# Patient Record
Sex: Male | Born: 1990 | Race: Black or African American | Hispanic: No | Marital: Single | Smoking: Never smoker
Health system: Southern US, Community
[De-identification: ages and names within clinical notes are randomized; demographics above are authoritative.]

---
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
## 2016-12-24 ENCOUNTER — Encounter (HOSPITAL_COMMUNITY): Payer: Self-pay | Admitting: Emergency Medicine

## 2016-12-24 ENCOUNTER — Emergency Department (HOSPITAL_COMMUNITY)

## 2016-12-24 ENCOUNTER — Other Ambulatory Visit: Payer: Self-pay

## 2016-12-24 ENCOUNTER — Emergency Department (HOSPITAL_COMMUNITY)
Admission: EM | Admit: 2016-12-24 | Discharge: 2016-12-24 | Disposition: A | Discharge status: Home / Self Care | Attending: Emergency Medicine | Admitting: Emergency Medicine

## 2016-12-24 DIAGNOSIS — Z79899 Other long term (current) drug therapy: Secondary | ICD-10-CM | POA: Diagnosis not present

## 2016-12-24 DIAGNOSIS — Y929 Unspecified place or not applicable: Secondary | ICD-10-CM | POA: Insufficient documentation

## 2016-12-24 DIAGNOSIS — X501XXA Overexertion from prolonged static or awkward postures, initial encounter: Secondary | ICD-10-CM | POA: Insufficient documentation

## 2016-12-24 DIAGNOSIS — S93402A Sprain of unspecified ligament of left ankle, initial encounter: Secondary | ICD-10-CM

## 2016-12-24 DIAGNOSIS — Y999 Unspecified external cause status: Secondary | ICD-10-CM | POA: Insufficient documentation

## 2016-12-24 DIAGNOSIS — Y9302 Activity, running: Secondary | ICD-10-CM | POA: Insufficient documentation

## 2016-12-24 MED ORDER — IBUPROFEN 600 MG PO TABS: 600.0000 mg | ORAL_TABLET | Freq: Four times a day (QID) | ORAL | 0 refills | Status: AC | PRN

## 2016-12-24 NOTE — ED Provider Notes (Signed)
[facility] EMERGENCY DEPARTMENT Provider Note   CSN: [id number] Arrival date & time: 12/24/16  1704     History   Chief Complaint Chief Complaint  Patient presents with  . Ankle Pain    HPI Ricky Allen is a 26 y.o. male presenting with left ankle pain which occurred suddenly when the patient inverted the ankle when running about 4 hours ago.  Pain is aching, constant and worse with palpation, movement and weight bearing.  The patient was able to weight bear immediately after the event.  There is no radiation of pain and the patient denies numbness distal to the injury site.  The patients treatment prior to arrival included ibuprofen, rest and ice. Marland Kitchen.  HPI  History reviewed. No pertinent past medical history.  There are no active problems to display for this patient.   History reviewed. No pertinent surgical history.     Home Medications    Prior to Admission medications   Medication Sig Start Date End Date Taking? Authorizing Provider  ibuprofen (ADVIL,MOTRIN) 600 MG tablet Take 1 tablet (600 mg total) by mouth every 6 (six) hours as needed. 12/24/16   Burgess AmorIdol, Clariza Sickman, PA-C  ondansetron (ZOFRAN ODT) 4 MG disintegrating tablet Take 1 tablet (4 mg total) by mouth every 8 (eight) hours as needed for nausea. 12/04/11   Eber HongMiller, Brian, MD  potassium chloride (K-DUR) 10 MEQ tablet Take 1 tablet (10 mEq total) by mouth 2 (two) times daily. 12/04/11   Eber HongMiller, Brian, MD  promethazine (PHENERGAN) 25 MG tablet Take 1 tablet (25 mg total) by mouth every 6 (six) hours as needed for nausea. 12/04/11   Eber HongMiller, Brian, MD    Family History History reviewed. No pertinent family history.  Social History Social History   Tobacco Use  . Smoking status: Never Smoker  . Smokeless tobacco: Never Used  Substance Use Topics  . Alcohol use: No  . Drug use: No     Allergies   Patient has no known allergies.   Review of Systems Review of Systems  Musculoskeletal: Positive for  arthralgias and joint swelling.  Skin: Negative for wound.  Neurological: Negative for weakness and numbness.     Physical Exam Updated Vital Signs BP (!) 147/78   Pulse (!) 50   Temp 98.4 F (36.9 C) (Oral)   Resp 18   Ht 6\' 3"  (1.905 m)   Wt 102.1 kg (225 lb)   SpO2 100%   BMI 28.12 kg/m   Physical Exam  Constitutional: He appears well-developed and well-nourished.  HENT:  Head: Normocephalic.  Cardiovascular: Normal rate and intact distal pulses. Exam reveals no decreased pulses.  Pulses:      Dorsalis pedis pulses are 2+ on the right side, and 2+ on the left side.       Posterior tibial pulses are 2+ on the right side, and 2+ on the left side.  Musculoskeletal: He exhibits edema and tenderness.       Left ankle: He exhibits swelling. He exhibits no ecchymosis, no deformity and normal pulse. Tenderness. Lateral malleolus tenderness found. No head of 5th metatarsal and no proximal fibula tenderness found. Achilles tendon normal.  Neurological: He is alert. No sensory deficit.  Skin: Skin is warm, dry and intact.  Nursing note and vitals reviewed.    ED Treatments / Results  Labs (all labs ordered are listed, but only abnormal results are displayed) Labs Reviewed - No data to display  EKG  EKG Interpretation None  Radiology Dg Ankle Complete Left  Result Date: 12/24/2016 CLINICAL DATA:  Inversion injury today left ankle while playing basketball with lateral pain and swelling. EXAM: LEFT ANKLE COMPLETE - 3+ VIEW COMPARISON:  None. FINDINGS: Soft tissue swelling laterally. Ankle mortise is normal. There is no acute fracture or dislocation. Chronic well corticated oval 2.5 mm fragment distal to the fibula. IMPRESSION: No acute fracture. Electronically Signed   By: Elberta Fortisaniel  Boyle M.D.   On: 12/24/2016 17:45    Procedures Procedures (including critical care time)  Medications Ordered in ED Medications - No data to display   Initial Impression / Assessment  and Plan / ED Course  I have reviewed the triage vital signs and the nursing notes.  Pertinent labs & imaging results that were available during my care of the patient were reviewed by me and considered in my medical decision making (see chart for details).     Pt with ankle sprain per exam and imaging, RICE, crutches, aso provided. Plan f/u with primary provider in 7-10 days for recheck if not improved ([place] [employer] [place]  Final Clinical Impressions(s) / ED Diagnoses   Final diagnoses:  Sprain of left ankle, unspecified ligament, initial encounter    ED Discharge Orders        Ordered    ibuprofen (ADVIL,MOTRIN) 600 MG tablet  Every 6 hours PRN     12/24/16 1726       Burgess Amordol, Shree Espey, PA-C 12/24/16 1746    Burgess AmorIdol, Maansi Wike, PA-C 12/24/16 1751    Mancel BaleWentz, Elliott, MD 12/24/16 [phone]

## 2016-12-24 NOTE — Discharge Instructions (Addendum)
Wear the ASO and use crutches to avoid weight bearing.  Use ice and elevation as much as possible for the next several days to help reduce the swelling.  Your xrays show no evidence of a break.   You may take the ibuprofen prescribed for swelling and pain relief.  This will make you drowsy - do not drive within 4 hours of taking this medication.   You may benefit from physical therapy of your ankle if it is not getting better over the next week.

## 2016-12-24 NOTE — ED Triage Notes (Signed)
Pt was playing basketball and rolled left ankle. Swelling noted. VSS.

## 2018-05-05 IMAGING — DX DG ANKLE COMPLETE 3+V*L*
3 series · 3 of 3 positions shown · non-contrast
Comparison: None.

CLINICAL DATA: Inversion injury today left ankle while playing
basketball with lateral pain and swelling.

EXAM:
LEFT ANKLE COMPLETE - 3+ VIEW

[ankle obl]
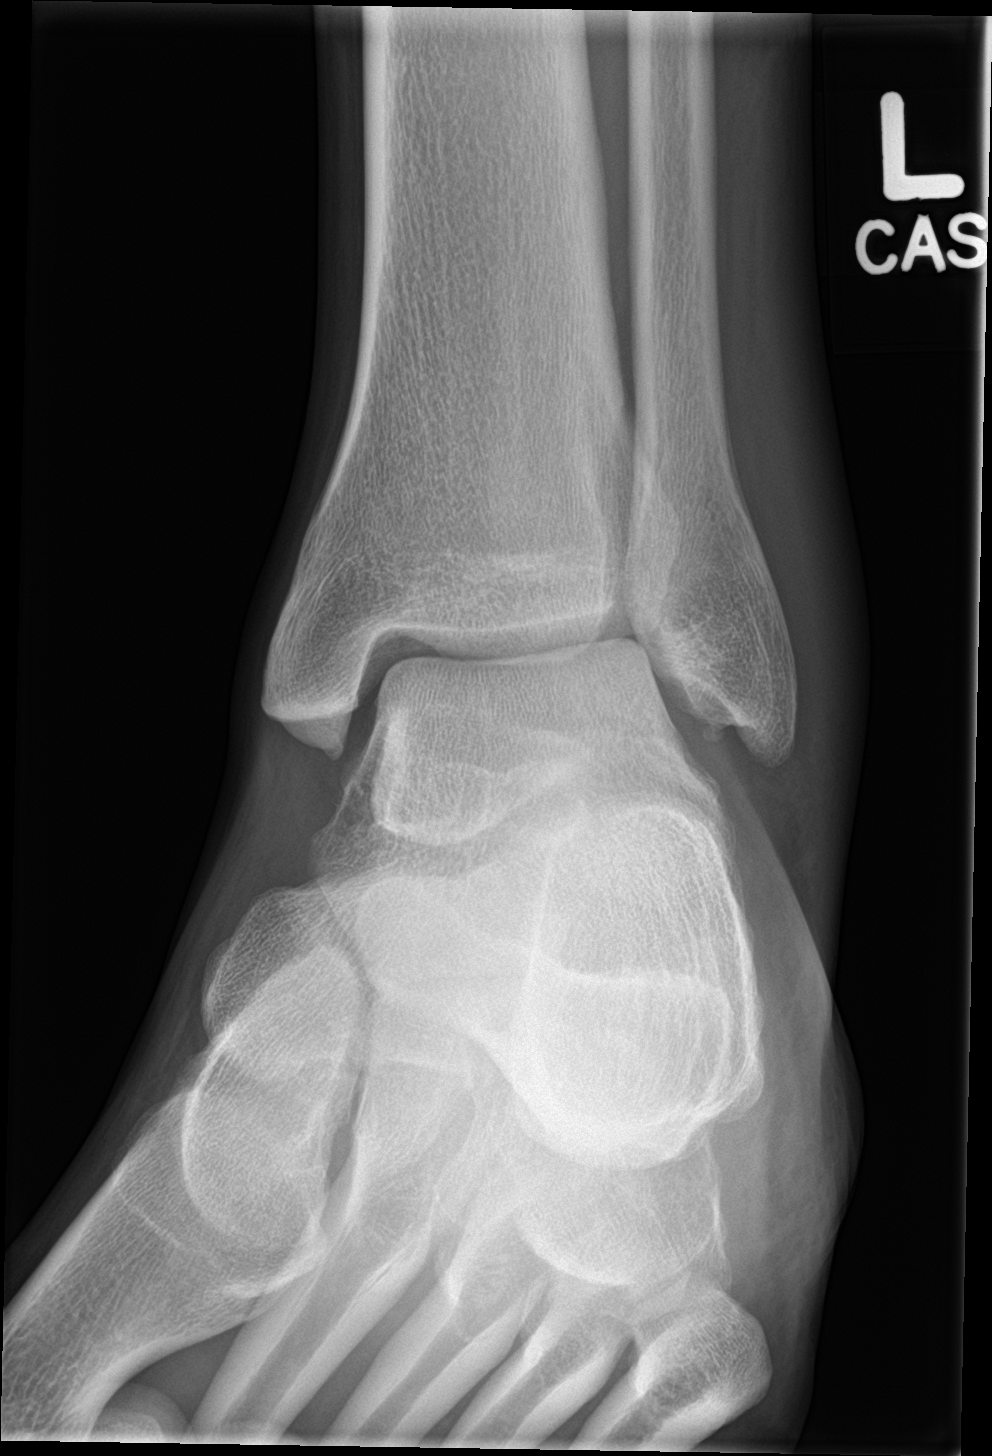

[ankle lat]
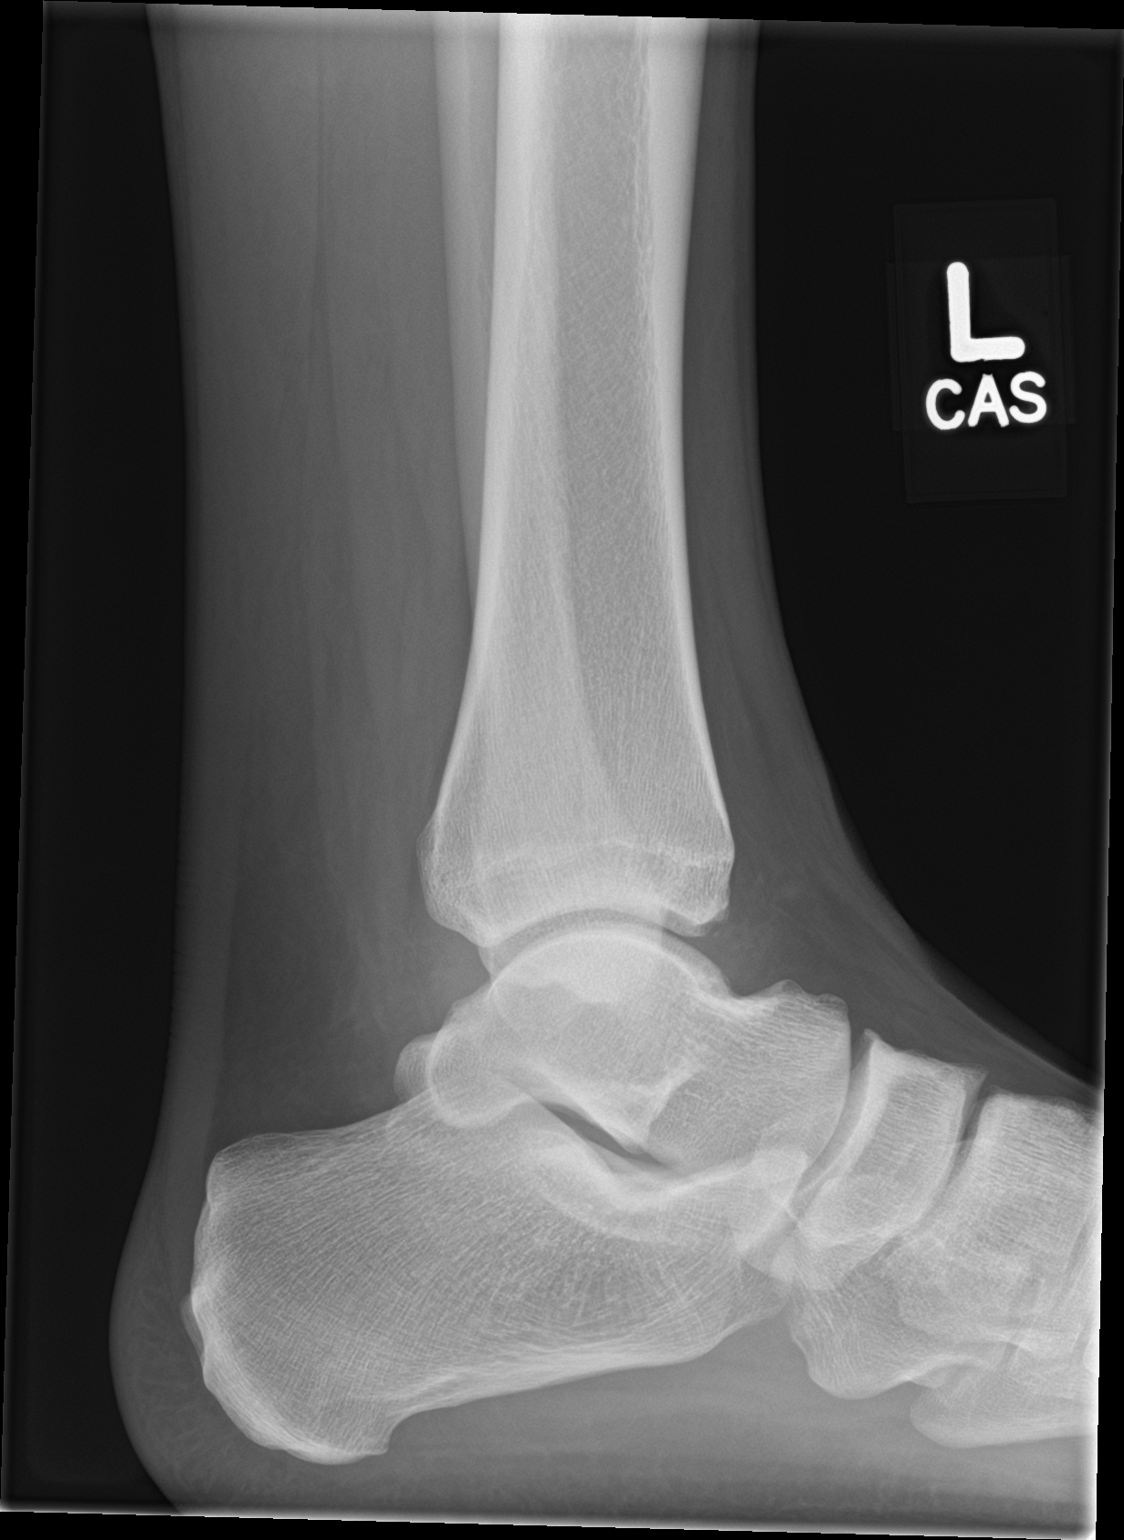

[ankle ap]
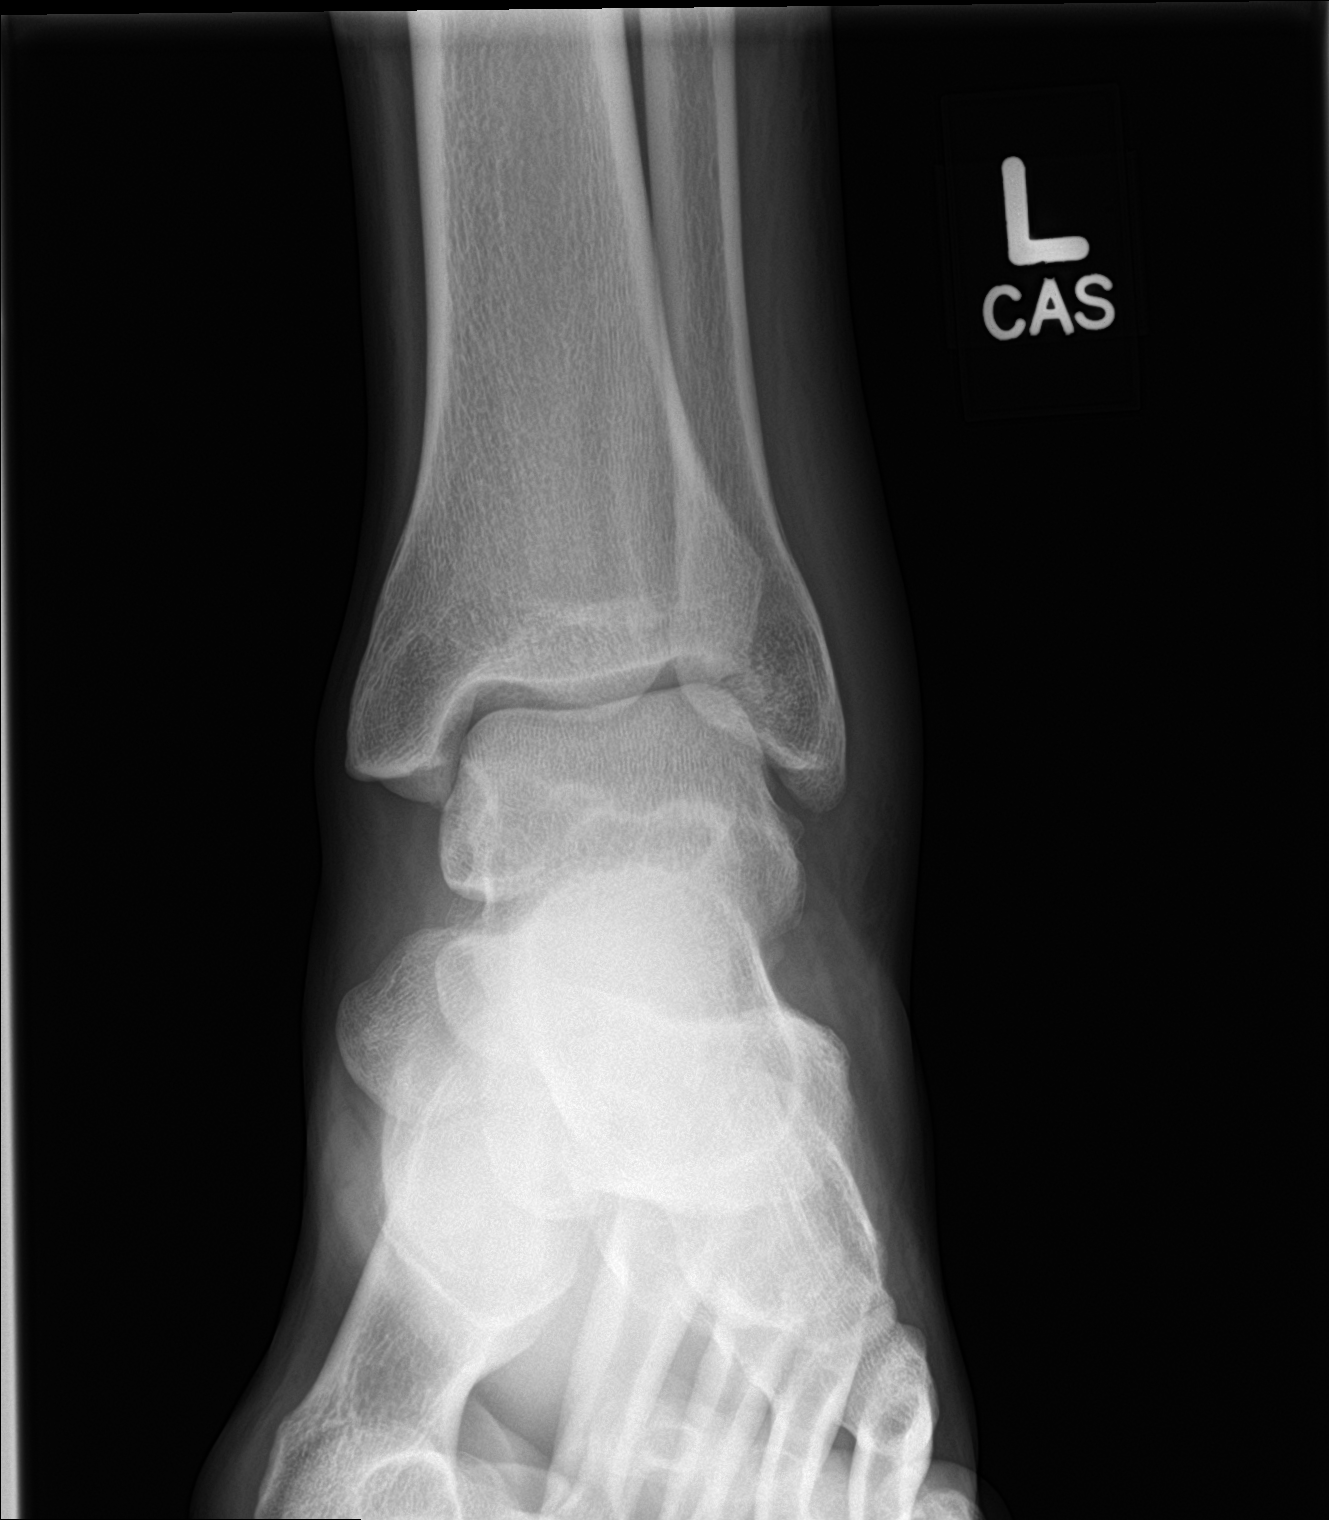

[3 of 3 positions shown; findings below may reference images not displayed]

FINDINGS: Soft tissue swelling laterally. Ankle mortise is normal. There is no
acute fracture or dislocation. Chronic well corticated oval 2.5 mm
fragment distal to the fibula.
IMPRESSION: No acute fracture.

## 2020-01-04 ENCOUNTER — Other Ambulatory Visit: Payer: Self-pay

## 2020-01-04 ENCOUNTER — Encounter (HOSPITAL_COMMUNITY): Payer: Self-pay | Admitting: Emergency Medicine

## 2020-01-04 ENCOUNTER — Emergency Department (HOSPITAL_COMMUNITY)
Admission: EM | Admit: 2020-01-04 | Discharge: 2020-01-04 | Disposition: A | Discharge status: Home / Self Care | Attending: Emergency Medicine | Admitting: Emergency Medicine

## 2020-01-04 DIAGNOSIS — R0981 Nasal congestion: Secondary | ICD-10-CM | POA: Diagnosis present

## 2020-01-04 DIAGNOSIS — U071 COVID-19: Secondary | ICD-10-CM

## 2020-01-04 LAB — RESP PANEL BY RT-PCR (FLU A&B, COVID) ARPGX2
Influenza A by PCR: NEGATIVE
Influenza B by PCR: NEGATIVE
SARS Coronavirus 2 by RT PCR: POSITIVE — AB

## 2020-01-04 NOTE — ED Provider Notes (Signed)
[facility] EMERGENCY DEPARTMENT Provider Note   CSN: [id number] Arrival date & time: 01/04/20  1016     History Chief Complaint  Patient presents with  . Nasal Congestion    Ricky Allen is a 30 y.o. male.  Ricky Allen is a 30 y.o. male who is otherwise healthy, presents to the ED for evaluation of 3 days of nasal congestion and sore throat.  He reports otherwise he has felt fine with no fevers or chills.  Denies body aches or fatigue.  Has only had an occasional cough to clear congestion.  No loss of taste or smell.  No chest pain or shortness of breath.  No abdominal pain, nausea, vomiting or diarrhea.  Patient has had 2 doses of the Pfizer Covid vaccine.  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  No other aggravating or alleviating factors.  The history is provided by the patient.       History reviewed. No pertinent past medical history.  There are no problems to display for this patient.   History reviewed. No pertinent surgical history.     History reviewed. No pertinent family history.  Social History   Tobacco Use  . Smoking status: Never Smoker  . Smokeless tobacco: Never Used  Vaping Use  . Vaping Use: Never used  Substance Use Topics  . Alcohol use: No  . Drug use: No    Home Medications Prior to Admission medications   Medication Sig Start Date End Date Taking? Authorizing Provider  ibuprofen (ADVIL,MOTRIN) 600 MG tablet Take 1 tablet (600 mg total) by mouth every 6 (six) hours as needed. 12/24/16   Evalee Jefferson, PA-C  ondansetron (ZOFRAN ODT) 4 MG disintegrating tablet Take 1 tablet (4 mg total) by mouth every 8 (eight) hours as needed for nausea. 12/04/11   Noemi Chapel, MD  potassium chloride (K-DUR) 10 MEQ tablet Take 1 tablet (10 mEq total) by mouth 2 (two) times daily.  12/04/11   Noemi Chapel, MD  promethazine (PHENERGAN) 25 MG tablet Take 1 tablet (25 mg total) by mouth every 6 (six) hours as needed for nausea. 12/04/11   Noemi Chapel, MD    Allergies    Sulfa antibiotics  Review of Systems   Review of Systems  Constitutional: Negative for chills and fever.  HENT: Positive for congestion, postnasal drip, rhinorrhea and sore throat. Negative for ear pain.   Respiratory: Negative for cough and shortness of breath.   Cardiovascular: Negative for chest pain.  Gastrointestinal: Negative for abdominal pain, diarrhea, nausea and vomiting.  Musculoskeletal: Negative for myalgias.  Skin: Negative for color change and rash.  Neurological: Negative for headaches.    Physical Exam Updated Vital Signs BP 134/76 (BP Location: Right Arm)   Pulse 62   Temp 98.4 F (36.9 C) (Oral)   Resp 16   Ht 6\' 3"  (1.905 m)   Wt 106.6 kg   SpO2 100%   BMI 29.37 kg/m 1  Physical Exam Vitals and nursing note reviewed.  Constitutional:      General: He is not in acute distress.    Appearance: Normal appearance. He is well-developed and normal weight. He is not ill-appearing or diaphoretic.     Comments: Well-appearing and in no distress   HENT:     Head:  Normocephalic and atraumatic.     Nose: Congestion and rhinorrhea present.     Mouth/Throat:     Mouth: Mucous membranes are moist.     Pharynx: Oropharynx is clear.  Eyes:     General:        Right eye: No discharge.        Left eye: No discharge.  Neck:     Comments: No rigidity Cardiovascular:     Rate and Rhythm: Normal rate and regular rhythm.     Heart sounds: Normal heart sounds. No murmur heard. No friction rub. No gallop.   Pulmonary:     Effort: Pulmonary effort is normal. No respiratory distress.     Breath sounds: Normal breath sounds.     Comments: Respirations equal and unlabored, patient able to speak in full sentences, lungs clear to auscultation bilaterally  Abdominal:     General: Bowel  sounds are normal. There is no distension.     Palpations: Abdomen is soft. There is no mass.     Tenderness: There is no abdominal tenderness. There is no guarding.     Comments: Abdomen soft, nondistended, nontender to palpation in all quadrants without guarding or peritoneal signs  Musculoskeletal:        General: No deformity.     Cervical back: Neck supple.  Lymphadenopathy:     Cervical: No cervical adenopathy.  Skin:    General: Skin is warm and dry.     Capillary Refill: Capillary refill takes less than 2 seconds.  Neurological:     Mental Status: He is alert and oriented to person, place, and time.  Psychiatric:        Mood and Affect: Mood normal.        Behavior: Behavior normal.     ED Results / Procedures / Treatments   Labs (all labs ordered are listed, but only abnormal results are displayed) Labs Reviewed  RESP PANEL BY RT-PCR (FLU A&B, COVID) ARPGX2 - Abnormal; Notable for the following components:      Result Value   SARS Coronavirus 2 by RT PCR POSITIVE (*)    All other components within normal limits    EKG None  Radiology No results found.  Procedures Procedures (including critical care time)  Medications Ordered in ED Medications - No data to display  ED Course  I have reviewed the triage vital signs and the nursing notes.  Pertinent labs & imaging results that were available during my care of the patient were reviewed by me and considered in my medical decision making (see chart for details).    MDM Rules/Calculators/A&P                         30 year old male presents with 3 days of nasal congestion sore throat, no other associated symptoms, patient is well-appearing with normal vitals has not had fevers, fatigue, body aches or any GI symptoms.  Denies shortness of breath.  Lungs are clear with no increased work of breathing.  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  His Covid test today is positive.  Patient has very mild symptoms, no criteria for admission.  Discussed symptomatic treatment and return precautions.  Discharged home in good condition.  Ricky Allen was evaluated in Emergency Department on 01/04/2020 for the symptoms described in the history of present illness. He  was evaluated in the context of the global COVID-19 pandemic, which necessitated consideration that the patient might be at risk for infection with the SARS-CoV-2 virus that causes COVID-19. Institutional protocols and algorithms that pertain to the evaluation of patients at risk for COVID-19 are in a state of rapid change based on information released by regulatory bodies including the CDC and federal and state organizations. These policies and algorithms were followed during the patient's care in the ED.  Final Clinical Impression(s) / ED Diagnoses Final diagnoses:  COVID-19 virus infection    Rx / DC Orders ED Discharge Orders    None       Legrand Rams 01/04/20 1154    Bethann Berkshire, MD 01/09/20 1027

## 2020-01-04 NOTE — ED Triage Notes (Signed)
Pt c/o nasal congestion that began yesterday. Pt took an at home Covid test and was positive.  Pt works in [employer], [employer].

## 2020-01-04 NOTE — Discharge Instructions (Addendum)
You have tested positive for Covid.  Please continue to quarantine at home and monitor your symptoms closely.  Antibiotics are not helpful in treating viral infection, the virus should run its course in about  7-14 days.  Fortunately unvaccinated patients symptoms often remain mild and or shorter lived.  Please make sure you are drinking plenty of fluids. You can treat your symptoms supportively with tylenol ibuprofen for fevers and pains, Flonase for nasal congestion and over the counter cough syrups and throat lozenges to help with cough. If your symptoms are not improving please follow up with you Primary doctor.   I recommend that you purchase a home pulse ox to help better monitor your oxygen at home, if you start to have increased work of breathing or shortness of breath or your oxygen drops below 90% please immediately return to the hospital for reevaluation.  If you develop persistent fevers, shortness of breath or difficulty breathing, chest pain, severe headache and neck pain, persistent nausea and vomiting or other new or concerning symptoms return to the Emergency department.
